# Patient Record
Sex: Female | Born: 1985 | Hispanic: Yes | Marital: Single | Smoking: Never smoker
Health system: Southern US, Community
[De-identification: ages and names within clinical notes are randomized; demographics above are authoritative.]

## PROBLEM LIST (undated history)

## (undated) DIAGNOSIS — Z789 Other specified health status: Secondary | ICD-10-CM

## (undated) HISTORY — PX: OTHER SURGICAL HISTORY: SHX169

## (undated) HISTORY — DX: Other specified health status: Z78.9

---

## 2010-02-22 ENCOUNTER — Ambulatory Visit: Payer: Self-pay | Admitting: Family Medicine

## 2010-06-28 ENCOUNTER — Observation Stay: Payer: Self-pay | Admitting: Obstetrics and Gynecology

## 2010-06-29 ENCOUNTER — Observation Stay: Payer: Self-pay | Admitting: Obstetrics and Gynecology

## 2010-06-30 ENCOUNTER — Observation Stay: Payer: Self-pay | Admitting: Obstetrics and Gynecology

## 2010-07-02 ENCOUNTER — Inpatient Hospital Stay: Payer: Self-pay | Admitting: Obstetrics and Gynecology

## 2015-01-08 DIAGNOSIS — E663 Overweight: Secondary | ICD-10-CM | POA: Insufficient documentation

## 2015-01-09 ENCOUNTER — Ambulatory Visit: Payer: Self-pay | Admitting: Family Medicine

## 2015-01-11 ENCOUNTER — Emergency Department: Payer: Self-pay | Admitting: Student

## 2015-02-23 IMAGING — US US OB < 14 WEEKS - US OB TV
1 series · 14 of 28 positions shown · non-contrast
Comparison: None.

CLINICAL DATA: Vaginal spotting

EXAM:
OBSTETRIC <14 WK US AND TRANSVAGINAL OB US
TECHNIQUE: Both transabdominal and transvaginal ultrasound examinations were
performed for complete evaluation of the gestation as well as the
maternal uterus, adnexal regions, and pelvic cul-de-sac.
Transvaginal technique was performed to assess early pregnancy.

[Series 1: us ob < 14 weeks - us ob tv · 0.25mm/px · 14 of 80 slices shown]
[im 3/80]
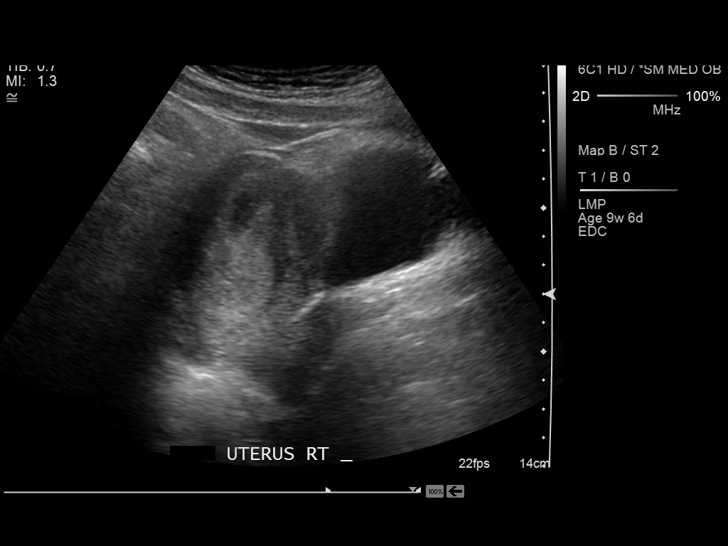
[im 9/80]
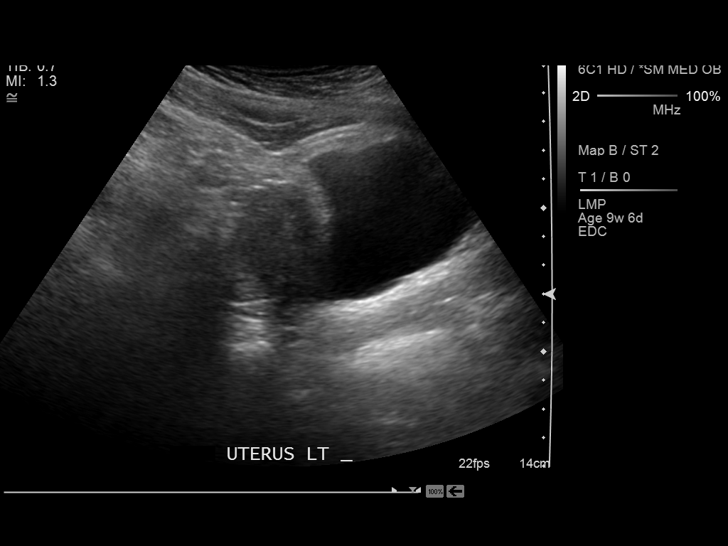
[im 15/80]
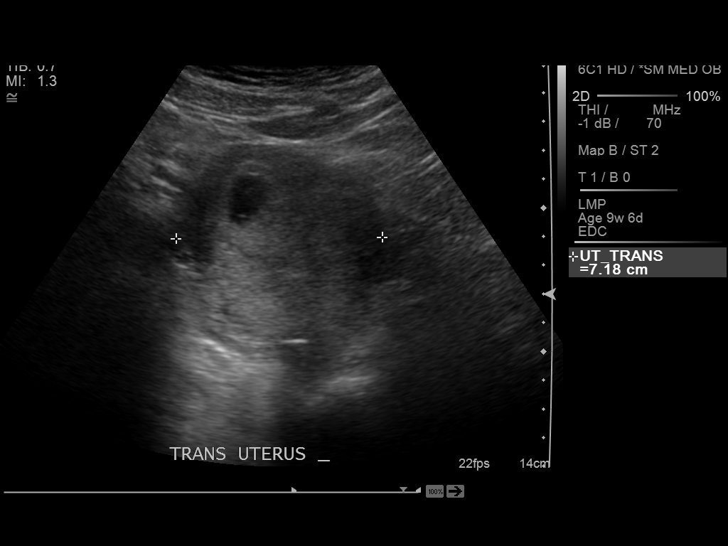
[im 21/80]
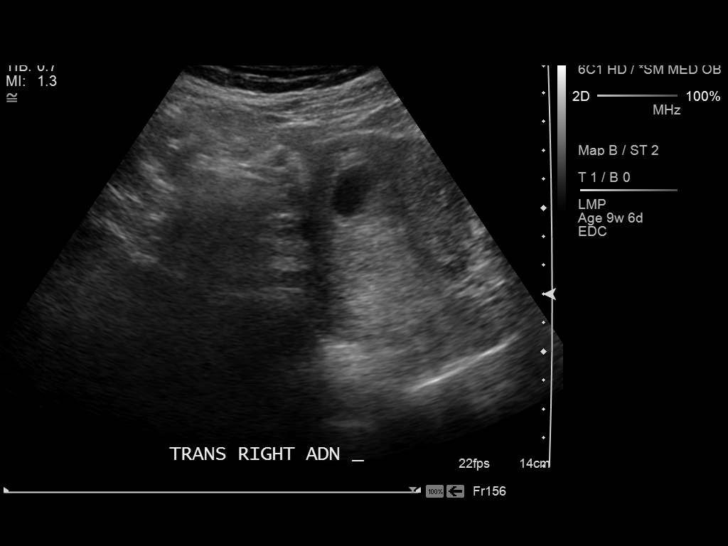
[im 27/80]
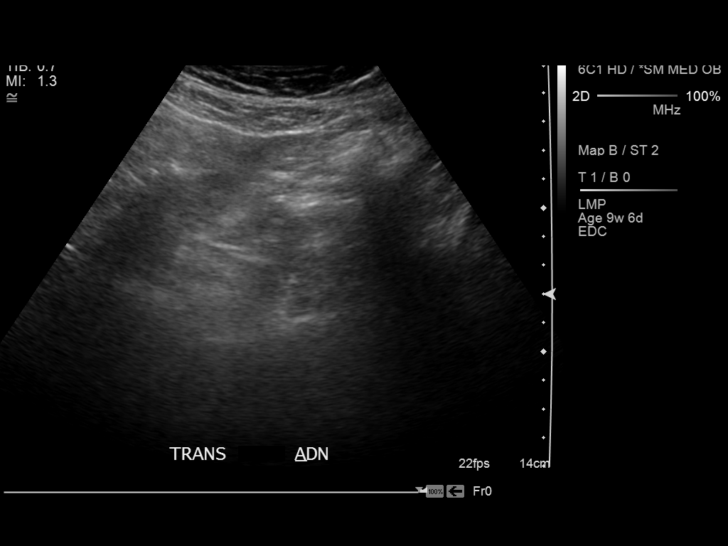
[im 33/80]
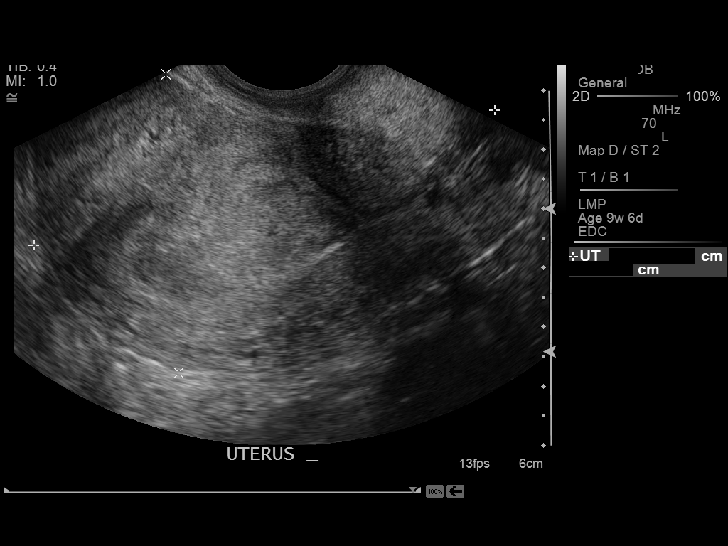
[im 39/80]
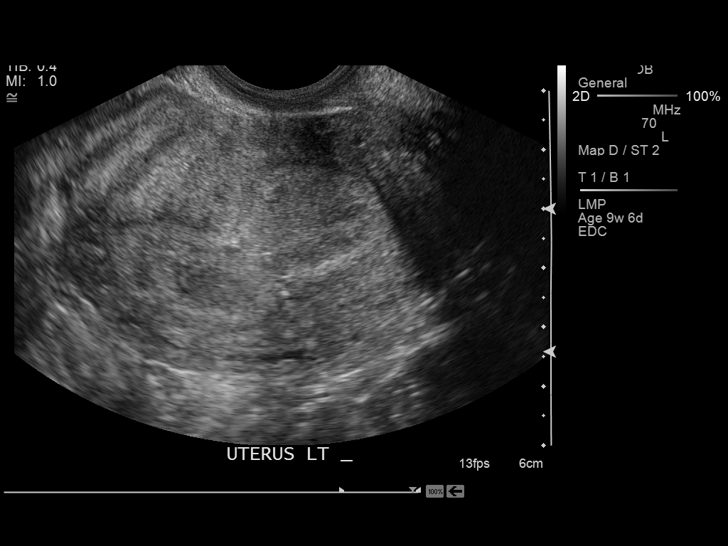
[im 44/80]
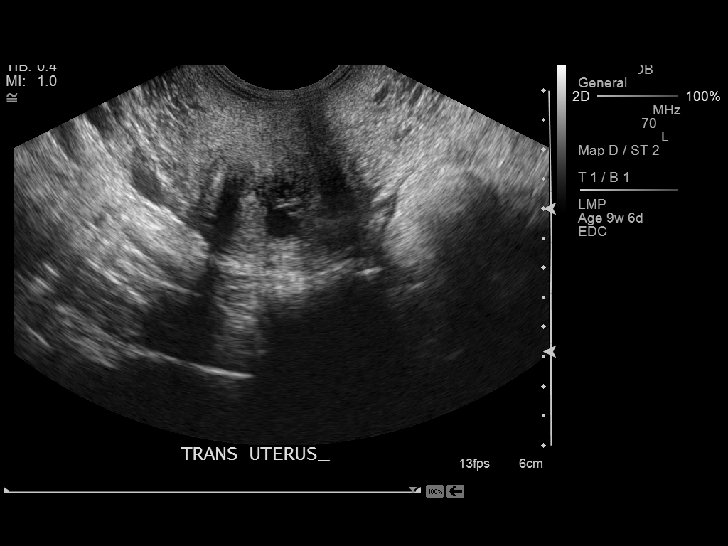
[im 50/80]
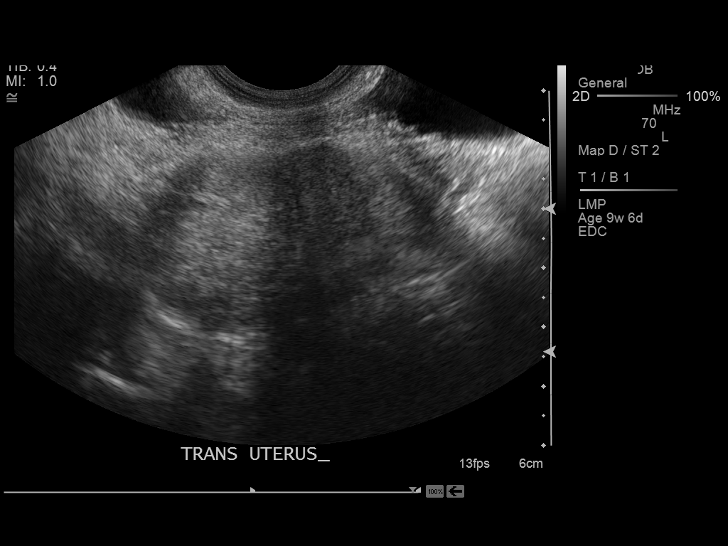
[im 56/80]
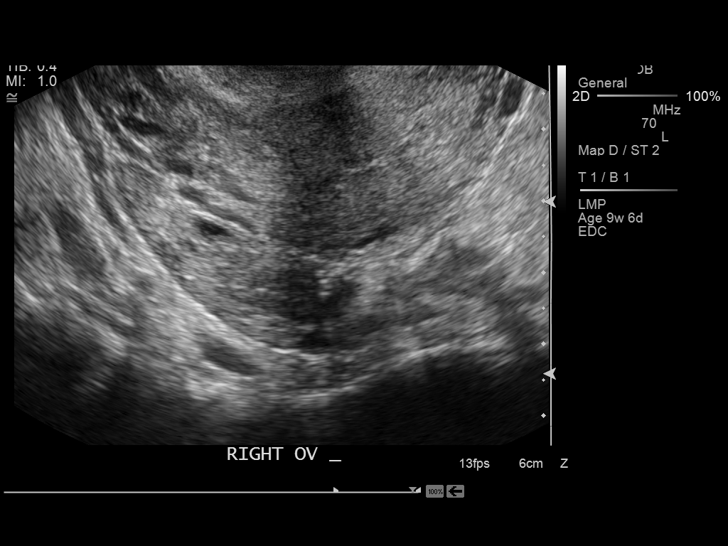
[im 62/80]
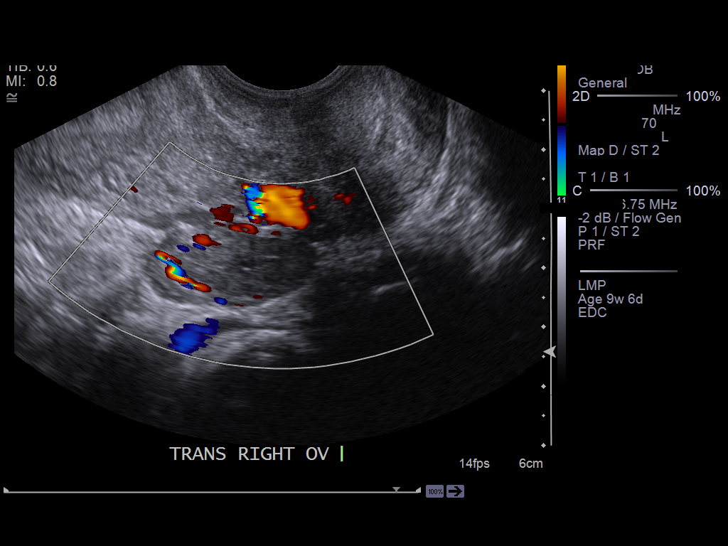
[im 68/80]
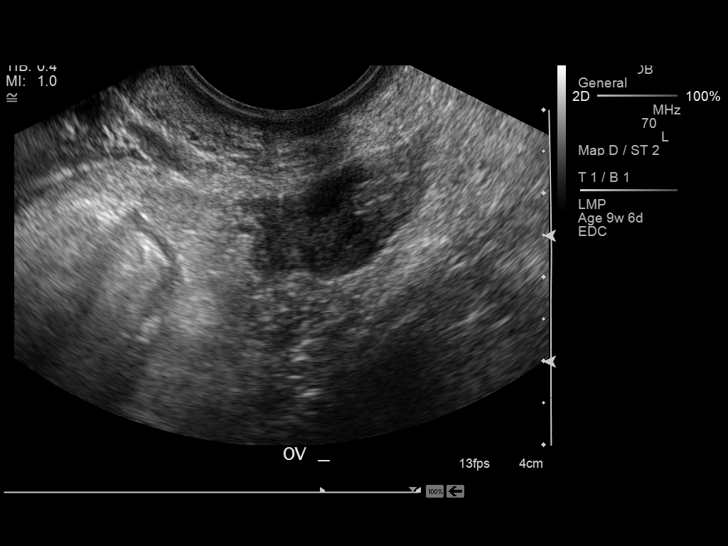
[im 74/80]
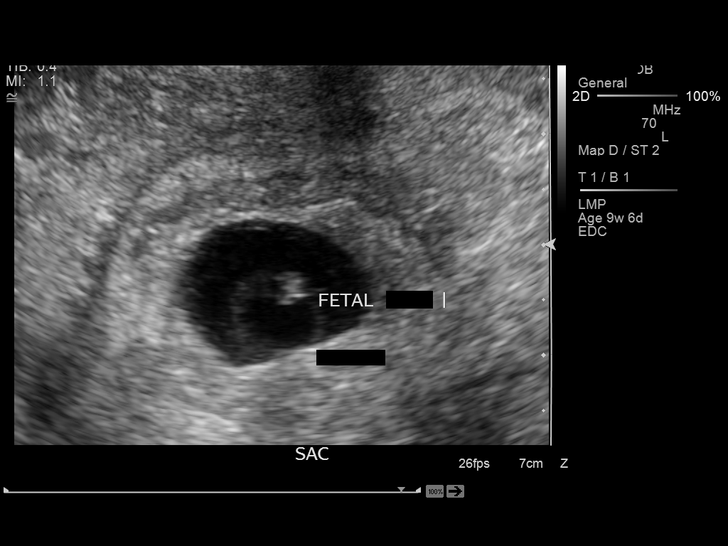
[im 80/80]
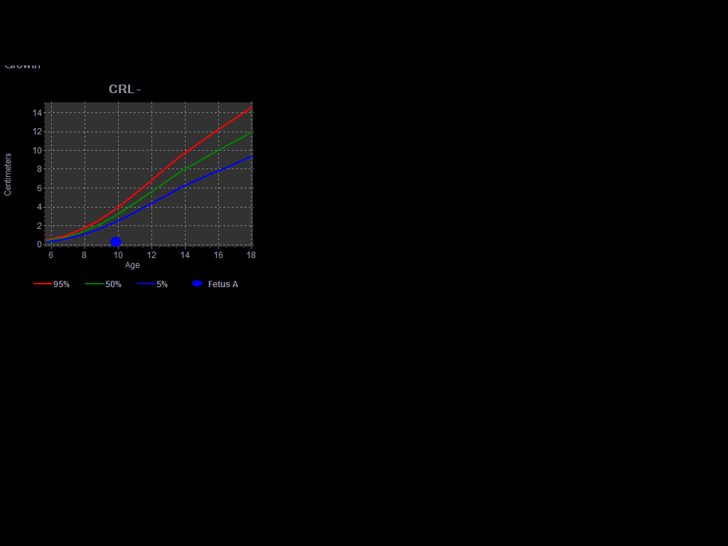

[14 of 28 positions shown; findings below may reference images not displayed]

FINDINGS: Intrauterine gestational sac: Visualized/normal in shape.

Yolk sac:  Visualized

Embryo:  Visualized

Cardiac Activity: Not visualized

CRL:  3 mm  mm   5 w   6 d                  US EDC: September 05, 2015

Maternal uterus/adnexae: There is no demonstrable subchorionic
hemorrhage. The cervical os is closed. There is a probable right
corpus luteum measuring 2.2 x 1.4 x 1.9 cm. No other extrauterine
pelvic or adnexal mass. No free pelvic fluid.
IMPRESSION: There is a single intrauterine gestation. Cardiac activity is not
yet seen by either transabdominal or transvaginal technique. This
finding warrants a followup study in approximately 14 days to
further evaluate for fetal heart activity. Study otherwise
unremarkable. Corpus luteum on the right is a physiologic finding.

## 2016-10-06 LAB — OB RESULTS CONSOLE HEPATITIS B SURFACE ANTIGEN: Hepatitis B Surface Ag: NEGATIVE

## 2016-10-07 LAB — OB RESULTS CONSOLE HIV ANTIBODY (ROUTINE TESTING): HIV: NONREACTIVE

## 2016-10-07 LAB — OB RESULTS CONSOLE RPR: RPR: NONREACTIVE

## 2016-11-28 NOTE — L&D Delivery Note (Signed)
Date of delivery: 5/283/18 Estimated Date of Delivery: 05/10/17 Patient's last menstrual period was 08/07/2016. EGA: 495w0d  Delivery Note At 2:01 AM a viable female was delivered via Vaginal, Spontaneous Delivery (Presentation: ROA,).  APGAR: 8, 8; weight 3lb 12oz, 1690g .   Placenta status: intact, spontaneous.  Cord: 3vessels  with the following complications: none apparent. Thick meconium. Cord pH: arterial: 7.08 BE 9.2 ,   Venous: 7.03 BE 8.5  Anesthesia:  none Episiotomy:  none Lacerations:  Left periuretheral Suture Repair: 3.0 vicryl Est. Blood Loss (mL):  300cc  Mom presented to L&D with pelvic pain, found to be in pretern labor at 36.6 with preeclampsia and deep variables. Progressed from 1 to 4, then precipitiously progressed from 5 to complete, AROM for meconium. second stage: <165min with delivery of fetal head with restitution to ROT.   Anterior then posterior shoulders delivered without difficulty.  Baby placed on mom's chest, but sluggish so cord doubly clamped and cut while being attended to by peds.  Placenta small, meconium stained, spontaneously delivered, intact.   IV pitocin given for hemorrhage prophylaxis. Bright red bleeding, cervix intact, determined to be small periurethral tear, repaired with 3-0 Vicryl in figure-of-eight and hemostatic. Baby was given to mom briefly then brought to NICU for monitoring due to small size. There, we sang happy birthday to baby Jesus.    Mom to postpartum.  Baby to NICU.  Erian Rosengren C Tresia Revolorio 04/19/2017, 2:42 AM

## 2016-12-01 ENCOUNTER — Other Ambulatory Visit (HOSPITAL_COMMUNITY): Payer: Self-pay | Admitting: Family Medicine

## 2016-12-01 DIAGNOSIS — Z3482 Encounter for supervision of other normal pregnancy, second trimester: Secondary | ICD-10-CM

## 2016-12-15 ENCOUNTER — Encounter (HOSPITAL_COMMUNITY): Payer: Self-pay

## 2016-12-15 ENCOUNTER — Ambulatory Visit
Admission: RE | Admit: 2016-12-15 | Discharge: 2016-12-15 | Disposition: A | Discharge status: Home / Self Care | Payer: Self-pay | Source: Ambulatory Visit | Attending: Family Medicine | Admitting: Family Medicine

## 2016-12-15 DIAGNOSIS — Z3482 Encounter for supervision of other normal pregnancy, second trimester: Secondary | ICD-10-CM | POA: Insufficient documentation

## 2016-12-15 DIAGNOSIS — Z3A19 19 weeks gestation of pregnancy: Secondary | ICD-10-CM | POA: Insufficient documentation

## 2017-02-22 LAB — HM HIV SCREENING LAB: HM HIV Screening: NEGATIVE

## 2017-04-12 LAB — OB RESULTS CONSOLE GC/CHLAMYDIA
Chlamydia: NEGATIVE
GC PROBE AMP, GENITAL: NEGATIVE

## 2017-04-13 LAB — OB RESULTS CONSOLE GBS: STREP GROUP B AG: NEGATIVE

## 2017-04-18 ENCOUNTER — Inpatient Hospital Stay
Admission: EM | Admit: 2017-04-18 | Discharge: 2017-04-21 | DRG: 775 | Disposition: A | Discharge status: Home / Self Care | Payer: Medicaid Other | Attending: Obstetrics & Gynecology | Admitting: Obstetrics & Gynecology

## 2017-04-18 ENCOUNTER — Encounter: Payer: Self-pay | Admitting: *Deleted

## 2017-04-18 DIAGNOSIS — O99214 Obesity complicating childbirth: Secondary | ICD-10-CM | POA: Diagnosis present

## 2017-04-18 DIAGNOSIS — D509 Iron deficiency anemia, unspecified: Secondary | ICD-10-CM | POA: Diagnosis present

## 2017-04-18 DIAGNOSIS — E669 Obesity, unspecified: Secondary | ICD-10-CM | POA: Diagnosis present

## 2017-04-18 DIAGNOSIS — Z6833 Body mass index (BMI) 33.0-33.9, adult: Secondary | ICD-10-CM | POA: Diagnosis not present

## 2017-04-18 DIAGNOSIS — O9902 Anemia complicating childbirth: Secondary | ICD-10-CM | POA: Diagnosis present

## 2017-04-18 DIAGNOSIS — Z3A36 36 weeks gestation of pregnancy: Secondary | ICD-10-CM | POA: Diagnosis not present

## 2017-04-18 DIAGNOSIS — O365930 Maternal care for other known or suspected poor fetal growth, third trimester, not applicable or unspecified: Secondary | ICD-10-CM | POA: Diagnosis present

## 2017-04-18 DIAGNOSIS — O1490 Unspecified pre-eclampsia, unspecified trimester: Secondary | ICD-10-CM | POA: Diagnosis present

## 2017-04-18 DIAGNOSIS — O134 Gestational [pregnancy-induced] hypertension without significant proteinuria, complicating childbirth: Principal | ICD-10-CM | POA: Diagnosis present

## 2017-04-18 DIAGNOSIS — R109 Unspecified abdominal pain: Secondary | ICD-10-CM | POA: Diagnosis present

## 2017-04-18 LAB — COMPREHENSIVE METABOLIC PANEL
ALT: 27 U/L (ref 14–54)
ANION GAP: 6 (ref 5–15)
AST: 32 U/L (ref 15–41)
Albumin: 2.6 g/dL — ABNORMAL LOW (ref 3.5–5.0)
Alkaline Phosphatase: 170 U/L — ABNORMAL HIGH (ref 38–126)
BUN: 19 mg/dL (ref 6–20)
CHLORIDE: 109 mmol/L (ref 101–111)
CO2: 21 mmol/L — AB (ref 22–32)
Calcium: 8.5 mg/dL — ABNORMAL LOW (ref 8.9–10.3)
Creatinine, Ser: 0.68 mg/dL (ref 0.44–1.00)
GFR calc non Af Amer: >60 mL/min (ref >60)
Glucose, Bld: 77 mg/dL (ref 65–99)
POTASSIUM: 4 mmol/L (ref 3.5–5.1)
SODIUM: 136 mmol/L (ref 135–145)
Total Bilirubin: 0.2 mg/dL — ABNORMAL LOW (ref 0.3–1.2)
Total Protein: 6.3 g/dL — ABNORMAL LOW (ref 6.5–8.1)

## 2017-04-18 LAB — PROTEIN / CREATININE RATIO, URINE
CREATININE, URINE: 28 mg/dL
PROTEIN CREATININE RATIO: 3.68 mg/mg{creat} — AB (ref 0.00–0.15)
Total Protein, Urine: 103 mg/dL

## 2017-04-18 LAB — CBC
HEMATOCRIT: 37.6 % (ref 35.0–47.0)
Hemoglobin: 12.9 g/dL (ref 12.0–16.0)
MCH: 30.6 pg (ref 26.0–34.0)
MCHC: 34.2 g/dL (ref 32.0–36.0)
MCV: 89.3 fL (ref 80.0–100.0)
Platelets: 186 10*3/uL (ref 150–440)
RBC: 4.21 MIL/uL (ref 3.80–5.20)
RDW: 13.1 % (ref 11.5–14.5)
WBC: 10.2 10*3/uL (ref 3.6–11.0)

## 2017-04-18 MED ORDER — OXYTOCIN 40 UNITS IN LACTATED RINGERS INFUSION - SIMPLE MED
2.5000 [IU]/h | INTRAVENOUS | Status: DC
  Filled 2017-04-18: qty 1000

## 2017-04-18 MED ORDER — OXYTOCIN BOLUS FROM INFUSION
500.0000 mL | Freq: Once | INTRAVENOUS | Status: AC
  Administered 2017-04-19: 500 mL via INTRAVENOUS

## 2017-04-18 MED ORDER — SOD CITRATE-CITRIC ACID 500-334 MG/5ML PO SOLN
30.0000 mL | ORAL | Status: DC | PRN
  Filled 2017-04-18: qty 30

## 2017-04-18 MED ORDER — LABETALOL HCL 5 MG/ML IV SOLN
20.0000 mg | INTRAVENOUS | Status: DC | PRN
  Filled 2017-04-18: qty 16

## 2017-04-18 MED ORDER — SODIUM CHLORIDE 0.9 % IJ SOLN
INTRAMUSCULAR | Status: AC
  Filled 2017-04-18: qty 50

## 2017-04-18 MED ORDER — BUTORPHANOL TARTRATE 1 MG/ML IJ SOLN: 1.0000 mg | INTRAMUSCULAR | Status: DC | PRN

## 2017-04-18 MED ORDER — MISOPROSTOL 25 MCG QUARTER TABLET: 25.0000 ug | ORAL_TABLET | ORAL | Status: DC | PRN

## 2017-04-18 MED ORDER — LACTATED RINGERS IV SOLN: INTRAVENOUS | Status: DC

## 2017-04-18 MED ORDER — HYDRALAZINE HCL 20 MG/ML IJ SOLN
10.0000 mg | Freq: Once | INTRAMUSCULAR | Status: DC | PRN
  Filled 2017-04-18: qty 0.5

## 2017-04-18 MED ORDER — ACETAMINOPHEN 325 MG PO TABS: 650.0000 mg | ORAL_TABLET | ORAL | Status: DC | PRN

## 2017-04-18 MED ORDER — OXYTOCIN 40 UNITS IN LACTATED RINGERS INFUSION - SIMPLE MED: 1.0000 m[IU]/min | INTRAVENOUS | Status: DC

## 2017-04-18 MED ORDER — TERBUTALINE SULFATE 1 MG/ML IJ SOLN: 0.2500 mg | Freq: Once | INTRAMUSCULAR | Status: DC | PRN

## 2017-04-18 MED ORDER — LACTATED RINGERS IV SOLN: 500.0000 mL | INTRAVENOUS | Status: DC | PRN

## 2017-04-18 MED ORDER — LIDOCAINE HCL (PF) 1 % IJ SOLN
30.0000 mL | INTRAMUSCULAR | Status: DC | PRN
  Administered 2017-04-19: 30 mL via SUBCUTANEOUS

## 2017-04-18 MED ORDER — ONDANSETRON HCL 4 MG/2ML IJ SOLN: 4.0000 mg | Freq: Four times a day (QID) | INTRAMUSCULAR | Status: DC | PRN

## 2017-04-18 NOTE — Progress Notes (Signed)
Intrapartum note:  S: pain with contractions  O: BP (!) 143/80   Pulse 60   Temp 97.9 F (36.6 C) (Oral)   Resp 16   Ht 5' (1.524 m)   Wt 76.7 kg (169 lb)   LMP 08/07/2016   BMI 33.01 kg/m   SVE: 3.5cm/ 70% / -2 TOCO: difficult to trace FHT: 150 mod + accels + deep variables to 60s with spontaneous recovery  A/P:  30yo G2P1001 @ 36.6 with labor, at least GHTN, and recurrent category 2 strip  1. IUP:  Category 2 tracing with moderate variability and accels.  Fetal acidemia unlikely; continue with labor course.  May consider AROM to place IUPC and do amnioinfusion 2. Labor: expectant mgmt 3. GHTN: labs pending for preeclampsia dx.  No s/sx worsening or severe preeclampsia.  ----- Ranae Plumberhelsea Ward, MD Attending Obstetrician and Gynecologist [facility], Department of OB/GYN [facility]

## 2017-04-18 NOTE — H&P (Addendum)
OB History & Physical   History of Present Illness:  Chief Complaint:   HPI:  Alice Craig is a 31 y.o. [id number] female at 1362w6d dated by LMP of 08/07/16 c/w 12wk US with EDC of 05/10/17.  She presents to L&D with intermittent groin pain   +FM, no CTX, no LOF, no VB Denies: HA, visual changes, SOB, or RUQ/epigastric pain   Pregnancy Issues: 1. H/o preeclampsia with IOL @ 36wks in last pregnancy 6y ago 2. GHTN with this pregnancy; 24hr urine results not available 3. History of preterm birth 174. History of iron deficiency anemia  Maternal Medical History:  History reviewed. No pertinent past medical history.  History reviewed. No pertinent surgical history.  No Known Allergies  Prior to Admission medications   Medication Sig Start Date End Date Taking? Authorizing Provider  Prenatal Vit-Fe Fumarate-FA (PRENATAL MULTIVITAMIN) TABS tablet Take 1 tablet by mouth daily at 12 noon.   Yes [provider]     [facility]ren[facility] care [facility]   Social History: She  reports that she has never smoked. She has never used smokeless tobacco. She reports that she does not drink alcohol or use drugs.  Family History:  no gyn cancers  Review of Systems: A full review of systems was performed and negative except as noted in the HPI.     Physical Exam:  Vital Signs: BP 139/87   Pulse 69   Temp 97.9 F (36.6 C) (Oral)   Resp 16   Ht 5' (1.524 m)   Wt 76.7 kg (169 lb)   LMP 08/07/2016   BMI 33.01 kg/m  General: no acute distress.  HEENT: normocephalic, atraumatic Heart: regular rate & rhythm.  No murmurs/rubs/gallops Lungs: clear to auscultation bilaterally, normal respiratory effort Abdomen: soft, gravid, non-tender;  EFW: 6.3 Pelvic:   External: Normal external female genitalia  Cervix: Dilation: 1 / Effacement (%): 60 / Station: -2    Extremities: non-tender, symmetric, 1+ edema bilaterally.  DTRs: 2+  Neurologic: Alert & oriented x 3.    No  results found for this or any previous visit (from the past 24 hour(s)).  Pertinent Results:  Prenatal Labs: Blood type/Rh O+  Antibody screen neg  Rubella Immune  Varicella Immune  RPR NR  HBsAg Neg  HIV NR  GC neg  Chlamydia neg  Genetic screening negative  1 hour GTT 103  3 hour GTT   GBS neg   FHT: 150 mod + accels + occasional lates and occasional deep variables TOCO: irregular (poorly tracing)   Cephalic by leopolds   Assessment:  Alice Craig is a 31 y.o. [phone] female at 3062w6d with GHTN and category 2 strip.   Plan:  1. Admit to Labor & Delivery 2. CBC, T&S, Clrs, IVF 3. GBS negative  4. Consents obtained. 5. Continuous efm/toco 6. Move to labor room, will keep due to recurrent variables and category 2 tracing.  Will determine IOL method with cervical recheck; likely foley bulb. 7. GHTN vs Preeclampsia: no s/sx severe at this time.    ----- Ranae Plumberhelsea Ward, MD Attending Obstetrician and Gynecologist [facility], Department of OB/GYN [facility]

## 2017-04-18 NOTE — OB Triage Note (Signed)
Recvd pt from ED. Pt is spanish speaking only. We have interpreter on a stick. Pt c/o low back pain and some cramping. States she did not want to wait till tomorrow to come in. She has a scheduled induction for tomorrow. Feeling baby move ok.

## 2017-04-19 DIAGNOSIS — D509 Iron deficiency anemia, unspecified: Secondary | ICD-10-CM | POA: Diagnosis present

## 2017-04-19 DIAGNOSIS — O1490 Unspecified pre-eclampsia, unspecified trimester: Secondary | ICD-10-CM | POA: Diagnosis present

## 2017-04-19 LAB — CBC
HCT: 35.7 % (ref 35.0–47.0)
Hemoglobin: 12.2 g/dL (ref 12.0–16.0)
MCH: 31 pg (ref 26.0–34.0)
MCHC: 34.1 g/dL (ref 32.0–36.0)
MCV: 90.9 fL (ref 80.0–100.0)
PLATELETS: 152 10*3/uL (ref 150–440)
RBC: 3.93 MIL/uL (ref 3.80–5.20)
RDW: 13.4 % (ref 11.5–14.5)
WBC: 14 10*3/uL — AB (ref 3.6–11.0)

## 2017-04-19 LAB — TYPE AND SCREEN
ABO/RH(D): O POS
Antibody Screen: NEGATIVE

## 2017-04-19 MED ORDER — HYDROCORTISONE 2.5 % RE CREA
TOPICAL_CREAM | RECTAL | Status: DC | PRN
  Filled 2017-04-19: qty 28.35

## 2017-04-19 MED ORDER — MISOPROSTOL 200 MCG PO TABS
ORAL_TABLET | ORAL | Status: AC
  Filled 2017-04-19: qty 4

## 2017-04-19 MED ORDER — IBUPROFEN 600 MG PO TABS
600.0000 mg | ORAL_TABLET | Freq: Four times a day (QID) | ORAL | Status: DC
  Administered 2017-04-19 – 2017-04-21 (×9): 600 mg via ORAL
  Filled 2017-04-19 (×9): qty 1

## 2017-04-19 MED ORDER — DOCUSATE SODIUM 100 MG PO CAPS
100.0000 mg | ORAL_CAPSULE | Freq: Two times a day (BID) | ORAL | Status: DC
  Administered 2017-04-19 – 2017-04-21 (×4): 100 mg via ORAL
  Filled 2017-04-19 (×4): qty 1

## 2017-04-19 MED ORDER — ONDANSETRON HCL 4 MG PO TABS: 4.0000 mg | ORAL_TABLET | ORAL | Status: DC | PRN

## 2017-04-19 MED ORDER — WITCH HAZEL-GLYCERIN EX PADS: 1.0000 "application " | MEDICATED_PAD | CUTANEOUS | Status: DC | PRN

## 2017-04-19 MED ORDER — ONDANSETRON HCL 4 MG/2ML IJ SOLN
4.0000 mg | INTRAMUSCULAR | Status: DC | PRN
Start: 2017-04-19 — End: 2017-04-21

## 2017-04-19 MED ORDER — OXYTOCIN 10 UNIT/ML IJ SOLN
INTRAMUSCULAR | Status: AC
  Filled 2017-04-19: qty 2

## 2017-04-19 MED ORDER — BENZOCAINE-MENTHOL 20-0.5 % EX AERO: 1.0000 "application " | INHALATION_SPRAY | CUTANEOUS | Status: DC | PRN

## 2017-04-19 MED ORDER — AMMONIA AROMATIC IN INHA
RESPIRATORY_TRACT | Status: AC
  Filled 2017-04-19: qty 10

## 2017-04-19 MED ORDER — HYDROMORPHONE HCL 1 MG/ML IJ SOLN: 1.0000 mg | Freq: Once | INTRAMUSCULAR | Status: DC

## 2017-04-19 MED ORDER — IBUPROFEN 600 MG PO TABS
600.0000 mg | ORAL_TABLET | Freq: Four times a day (QID) | ORAL | Status: DC
  Administered 2017-04-19: 600 mg via ORAL
  Filled 2017-04-19: qty 1

## 2017-04-19 MED ORDER — DIPHENHYDRAMINE HCL 25 MG PO CAPS: 25.0000 mg | ORAL_CAPSULE | Freq: Four times a day (QID) | ORAL | Status: DC | PRN

## 2017-04-19 MED ORDER — PRENATAL MULTIVITAMIN CH
1.0000 | ORAL_TABLET | Freq: Every day | ORAL | Status: DC
  Administered 2017-04-19 – 2017-04-21 (×2): 1 via ORAL
  Filled 2017-04-19 (×2): qty 1

## 2017-04-19 MED ORDER — HYDROMORPHONE HCL 1 MG/ML IJ SOLN
INTRAMUSCULAR | Status: AC
  Filled 2017-04-19: qty 1

## 2017-04-19 MED ORDER — COCONUT OIL OIL
1.0000 | TOPICAL_OIL | Status: DC | PRN
Start: 2017-04-19 — End: 2017-04-21

## 2017-04-19 MED ORDER — SIMETHICONE 80 MG PO CHEW: 80.0000 mg | CHEWABLE_TABLET | ORAL | Status: DC | PRN

## 2017-04-19 MED ORDER — ACETAMINOPHEN 500 MG PO TABS
1000.0000 mg | ORAL_TABLET | Freq: Four times a day (QID) | ORAL | Status: DC | PRN
  Administered 2017-04-20: 1000 mg via ORAL
  Filled 2017-04-19: qty 2

## 2017-04-19 MED ORDER — LIDOCAINE HCL (PF) 1 % IJ SOLN
INTRAMUSCULAR | Status: AC
  Administered 2017-04-19: 30 mL via SUBCUTANEOUS
  Filled 2017-04-19: qty 30

## 2017-04-19 NOTE — Lactation Note (Signed)
This note was copied from a baby's chart. Lactation Consultation Note  Patient Name: Alice Craig Today's Date: 04/19/2017  Earlier today, I taught Mom about her milk supply and pumping for her baby in [place] with interpreter Maritza. I set it up and watched her demo the use of the pump. I taught her how to clean, label bottles, transport milk from home and how to [facility]. [facility] peer counselor Serafina Mitchellsmari spoke with her as well.    Maternal Data    Feeding    LATCH Score/Interventions                      Lactation Tools Discussed/Used     Consult Status      Sunday CornSandra Clark Keidra Withers 04/19/2017, 6:02 PM

## 2017-04-19 NOTE — Discharge Instructions (Signed)

## 2017-04-19 NOTE — Discharge Summary (Signed)
Obstetrical Discharge Summary  Patient Name: Alice Craig DOB: 08-15-1986 MRN: [id number]  Date of Admission: 04/18/2017 Date of Delivery: 04/19/17 Delivered by: Ranae Plumberhelsea Ward, MD Date of Discharge: 04/21/17 Primary OB: [facility]  NWG:[id number]:Patient's last menstrual period was 08/07/2016. EDC Estimated Date of Delivery: 05/10/17 Gestational Age at Delivery: 7255w0d   Antepartum complications: iron deficiency anemia, preeclampsia, obesity Admitting Diagnosis: preeclampsia, preterm labor Secondary Diagnosis: Patient Active Problem List   Diagnosis Date Noted  . Iron deficiency anemia 04/19/2017  . Preeclampsia 04/19/2017  . Preterm labor 04/19/2017  . SGA (small for gestational age), 1,500-1,749 grams 04/19/2017  . Labor and delivery indication for care or intervention 04/18/2017    Augmentation: none Complications: None Intrapartum complications/course: Mom presented to L&D with pelvic pain, found to be in pretern labor at 36.6 with preeclampsia and deep variables. Progressed from 1 to 4, then precipitiously progressed from 5 to complete, AROM for meconium. second stage: <55min with delivery of fetal head with restitution to ROT.   Anterior then posterior shoulders delivered without difficulty.  Baby placed on mom's chest, but sluggish so cord doubly clamped and cut while being attended to by peds.  Placenta small, meconium stained, spontaneously delivered, intact.   IV pitocin given for hemorrhage prophylaxis. Bright red bleeding, cervix intact, determined to be small periurethral tear, repaired with 3-0 Vicryl in figure-of-eight and hemostatic. Baby was given to mom briefly then brought to NICU for monitoring due to small size. There, we sang happy birthday to baby Jesus.   Date of Delivery: 04/19/17 Delivered By: Leeroy Bockhelsea Ward Delivery Type: spontaneous vaginal delivery Anesthesia: none Placenta: sponatneous Laceration: left periurethral  Episiotomy: none Newborn Data: Live born female  Birth  Weight: 3 lb 11.6 oz (1690 g) APGAR: 8, 8   Postpartum Procedures: none  Baby in nursery on d/c   Post partum course:  Patient had an uncomplicated postpartum course.  By time of discharge on PPD#2, her pain was controlled on oral pain medications; she had appropriate lochia and was ambulating, voiding without difficulty and tolerating regular diet.  She was deemed stable for discharge to home.    Discharge Physical Exam: 04/21/17 BP 126/80 (BP Location: Left Arm)   Pulse 71   Temp 98.8 F (37.1 C) (Oral)   Resp 18   Ht 5' (1.524 m)   Wt 76.7 kg (169 lb)   LMP 08/07/2016   SpO2 100%   Breastfeeding? Unknown   BMI 33.01 kg/m   General: NAD CV: RRR Pulm: CTABL, nl effort ABD: s/nd/nt, fundus firm and below the umbilicus Lochia: moderate DVT Evaluation: LE non-ttp, no evidence of DVT on exam.  Hemoglobin  Date Value Ref Range Status  04/19/2017 12.2 12.0 - 16.0 g/dL Final   HCT  Date Value Ref Range Status  04/19/2017 35.7 35.0 - 47.0 % Final     Disposition: stable, discharge to home. Baby Feeding: pumping  Baby Disposition: in nursery   Rh Immune globulin given: n/a Rubella vaccine given:  n/a Tdap vaccine given in AP or PP setting: AP Flu vaccine given in AP or PP setting: AP  Contraception: nexplanon PP  Prenatal Labs:  Blood type/Rh O+  Antibody screen neg  Rubella Immune  Varicella Immune  RPR NR  HBsAg Neg  HIV NR  GC neg  Chlamydia neg  Genetic screening negative  1 hour GTT 103  3 hour GTT   GBS neg      Plan:  Alice Craig was discharged to home in good condition.  Follow-up appointment at [facility] in 3 days for BP check   Discharge Medications: Allergies as of 04/21/2017   No Known Allergies     Medication List    TAKE these medications   ibuprofen 600 MG tablet Commonly known as:  ADVIL,MOTRIN Take 1 tablet (600 mg total) by mouth every 6 (six) hours.   prenatal multivitamin Tabs tablet Take 1 tablet by mouth daily at 12  noon.       Follow-up Information    Department, [facility] Follow up in 3 day(s).   Why:  for blood pressure and well-being check Contact information: [address] [place] [address] [phone]           Signed: Jennell Corner MD

## 2017-04-20 LAB — RPR: RPR Ser Ql: NONREACTIVE

## 2017-04-20 NOTE — Progress Notes (Addendum)
Post Partum Day 1 Subjective: "I am pumping but, nothing is coming out" Baby in NICU  Objective: Blood pressure 130/84, pulse 79, temperature 98.3 F (36.8 C), temperature source Oral, resp. rate 18, height 5' (1.524 m), weight 169 lb (76.7 kg), last menstrual period 08/07/2016, SpO2 99 %, unknown if currently breastfeeding.  Physical Exam:  General: alert, cooperative and appears stated age  Heart: S1S2, RRR, No M/R/G Lungs: CTA bilat, no W/R/R Lochia: appropriate Uterine Fundus: firm, Pt has a wrapped abd with a corset like ace wrap Voiding well, taking po well DVT Evaluation: Neg Homans    Recent Labs  04/18/17 2306 04/19/17 0605  HGB 12.9 12.2  HCT 37.6 35.7  WBC 10.2 14.0*  PLT 186 152    A:1. NSVD of pre-term infant 2. Lactation P: Dc in am   LOS: 2 days   Sharee Pimplearon W Jones 04/20/2017, 12:06 PM

## 2017-04-20 NOTE — Lactation Note (Addendum)
This note was copied from a baby's chart. Lactation Consultation Note  Patient Name: Alice Craig [id number] Date: 04/20/2017 Reason for consult: Follow-up assessment  Interpreter used for interview Maternal Data Has patient been taught Hand Expression?: Yes Does the patient have breastfeeding experience prior to this delivery?: Yes (did not make milk)      LATCH Score/Interventions     We have reviewed pumping and how to clean the pump kit as per the manufacturer. She has been told to pump every three hours around the clock. We called [employer]IC and she can pick up a pump today.      Consult Status   Continued support.   Lorenz Coaster Floreen Teegarden 04/20/2017, 2:11 PM

## 2017-04-21 LAB — SURGICAL PATHOLOGY

## 2017-04-21 MED ORDER — IBUPROFEN 600 MG PO TABS: 600.0000 mg | ORAL_TABLET | Freq: Four times a day (QID) | ORAL | 0 refills | Status: AC

## 2017-04-21 NOTE — Progress Notes (Signed)
Pt is ready to go home. Dc home ambulatory. Refused w/c. With sig other and child

## 2017-04-21 NOTE — Progress Notes (Signed)
Dc instructions and rx given. Interpreter assisted. Verbalized understanding. Answered all questions.  Pt is going to visit baby a little more before leaving the hospital.

## 2017-04-22 ENCOUNTER — Ambulatory Visit: Payer: Self-pay

## 2017-04-22 NOTE — Lactation Note (Signed)
This note was copied from a baby's chart. Lactation Consultation Note  Patient Name: Alice Teressa SenterJuana Ocampo Aranda [id number]'s Date: 04/22/2017  Mother of baby was unable to [facility] pump [facility] yesterday. I loaned her a symphony breast pump today [facility] use [facility], Rental agreement completed.  Mom was given instruction in use.    Maternal Data    Feeding Feeding Type: Formula  [facility] Score/Interventions                      Lactation Tools Discussed/Used     Consult Status      Alice Craig 04/22/2017, 4:58 PM

## 2017-04-27 ENCOUNTER — Ambulatory Visit: Payer: Self-pay

## 2017-04-27 NOTE — Lactation Note (Signed)
This note was copied from a baby's chart. Lactation Consultation Note  Patient Name: Alice Craig [id number]'UToday's Date: 04/27/2017 Reason for consult: Follow-up assessment   Maternal Data    Feeding Feeding Type: Bottle Fed - Breast Milk Nipple Type: Slow - flow Length of feed: 30 min   Lactation Tools Discussed/Used Tools: Bottle;Pump;23F feeding tube / Syringe  Mother has a ow milk supply but it is building. Consult Status      Alice Craig 04/27/2017, 12:36 PM

## 2017-05-17 ENCOUNTER — Ambulatory Visit: Payer: Self-pay

## 2017-05-17 NOTE — Lactation Note (Deleted)
This note was copied from a baby's chart. Lactation Consultation Note  Patient Name: Boy Teressa SenterJuana Ocampo Aranda Today's Date: 05/17/2017  Just prior to last feeding , baby Greig Castillandrew seemed to have some duskiness around his mouth. I asked Richarda OsmondSally Janicello, [facility] to [facility] him out before he fed. She said his O2s were 100%   Maternal Data    Feeding Feeding Type: Formula Nipple Type: Slow - flow Length of feed: 30 min  LATCH Score/Interventions                      Lactation Tools Discussed/Used     Consult Status      Sunday CornSandra Clark Izel Eisenhardt 05/17/2017, 4:19 PM

## 2017-12-07 IMAGING — US US OB COMP +14 WK
1 series · 14 of 28 positions shown · non-contrast
Comparison: none

CLINICAL DATA: Pregnancy.

EXAM:
OBSTETRICAL ULTRASOUND >14 WKS

[Series 1: us ob comp +14 wk · 0.26mm/px · 14 of 80 slices shown]
[im 3/80]
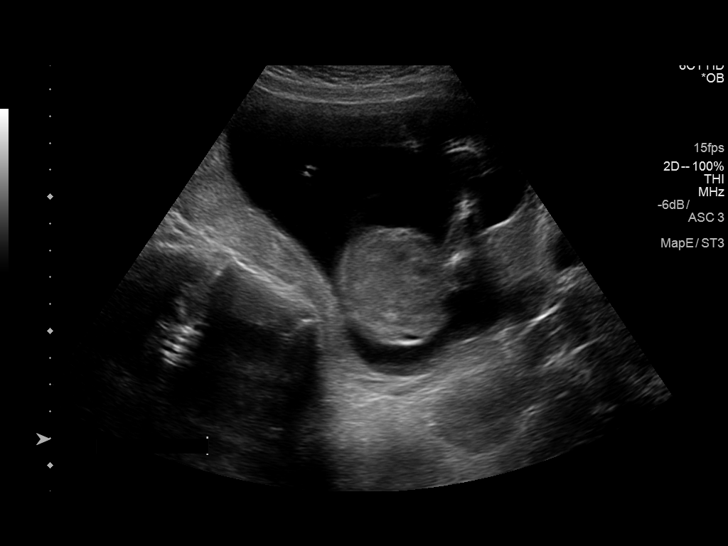
[im 9/80]
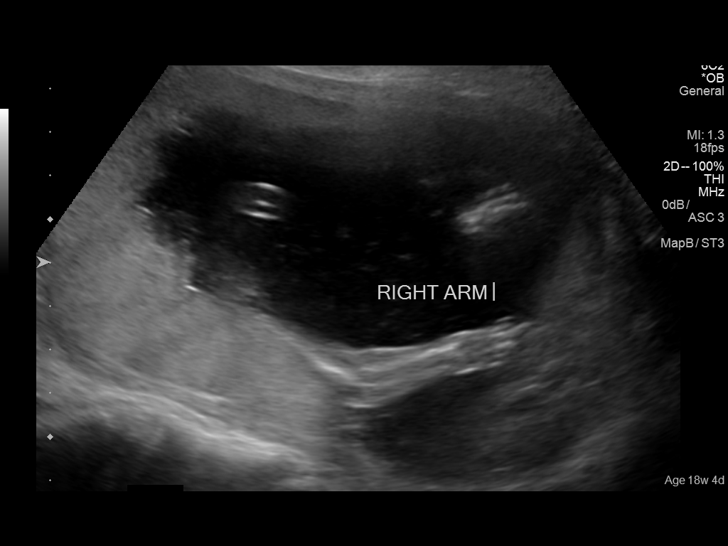
[im 15/80]
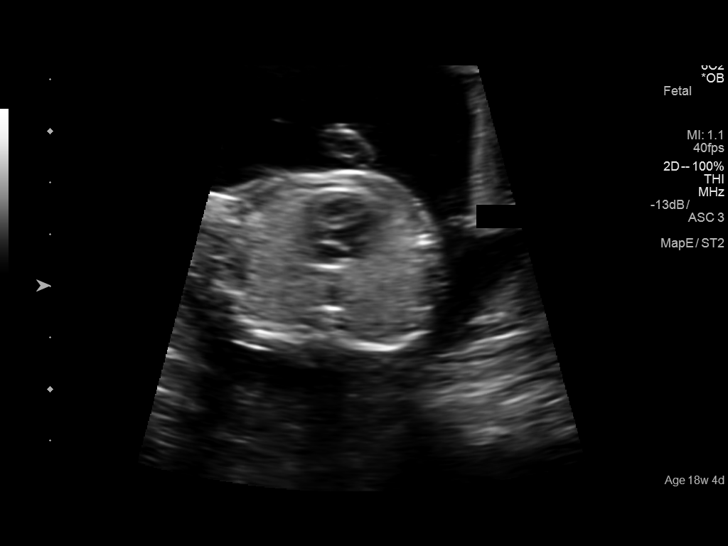
[im 21/80]
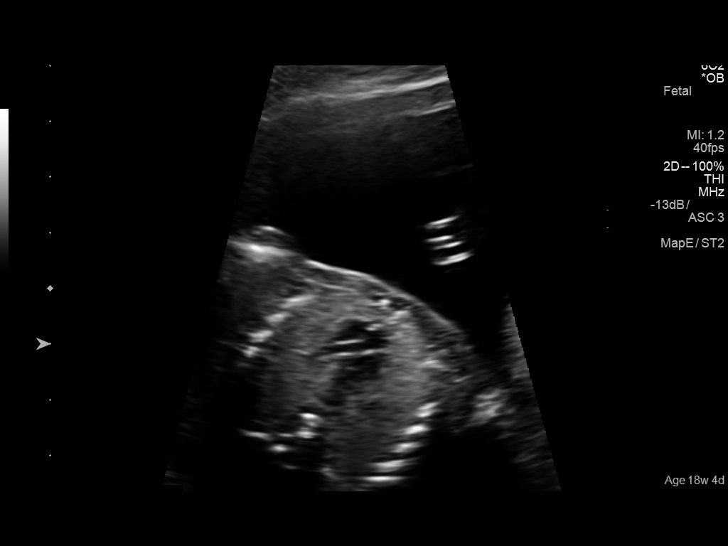
[im 27/80]
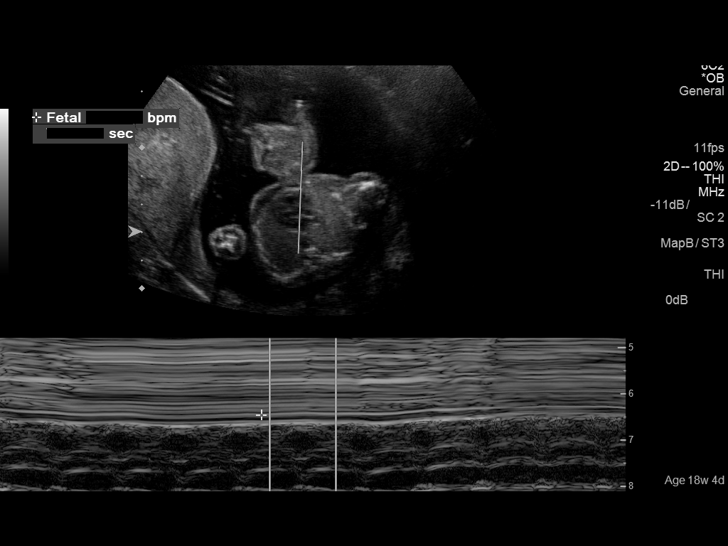
[im 33/80]
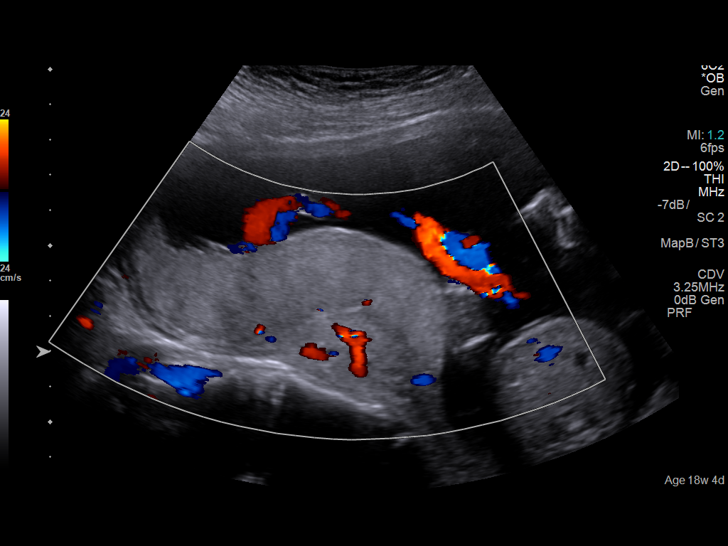
[im 39/80]
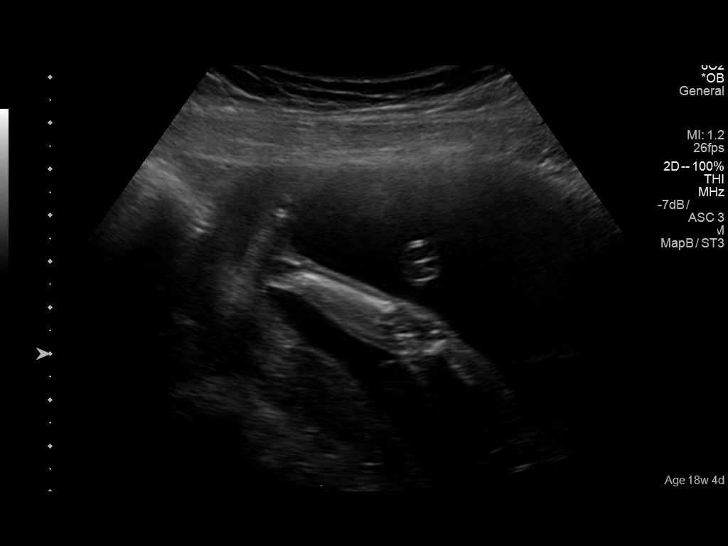
[im 44/80]
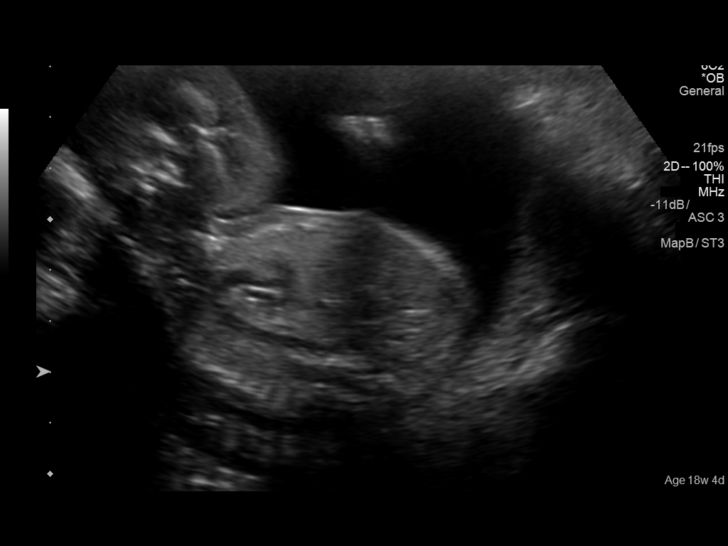
[im 50/80]
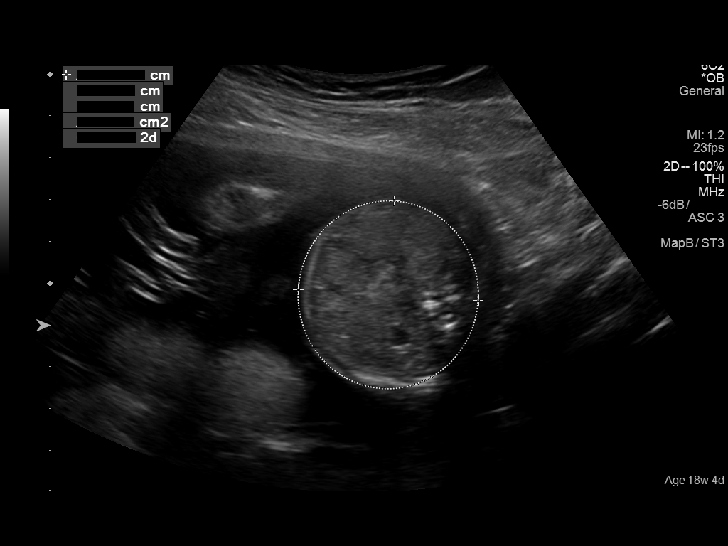
[im 56/80]
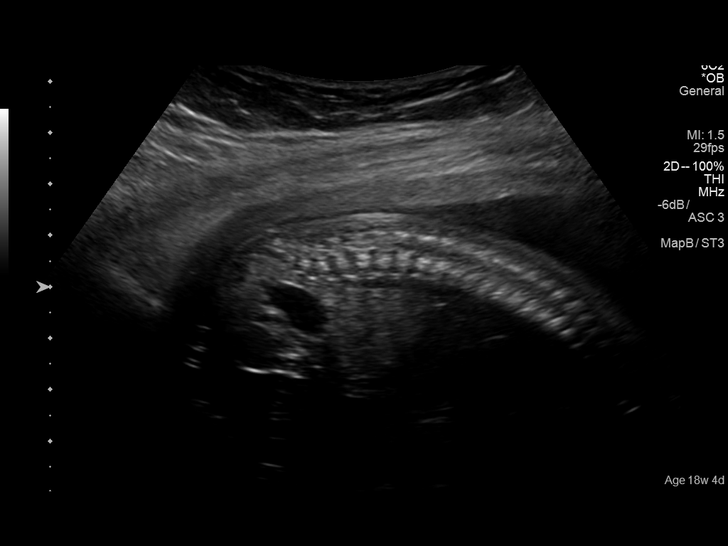
[im 62/80]
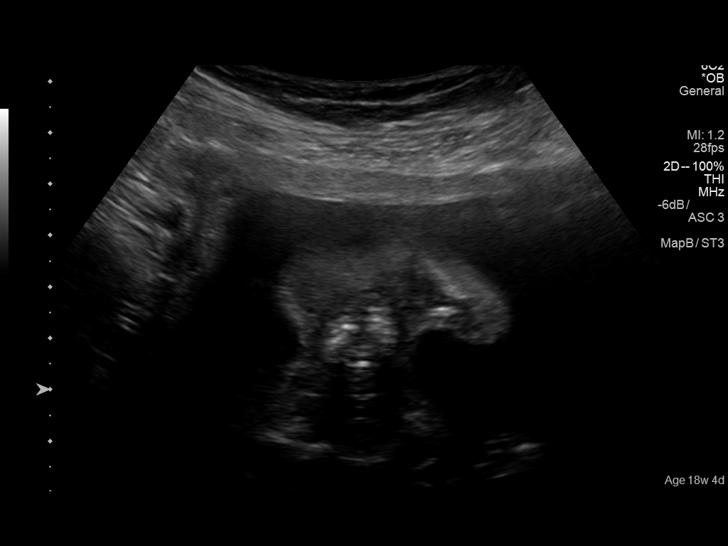
[im 68/80]
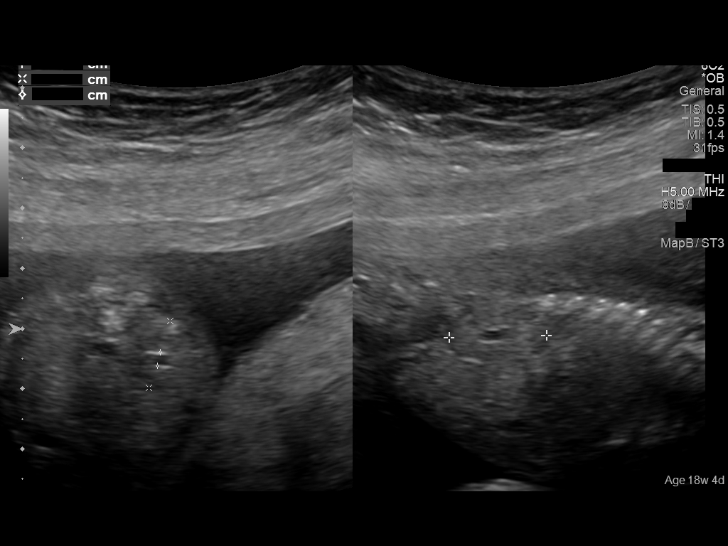
[im 74/80]
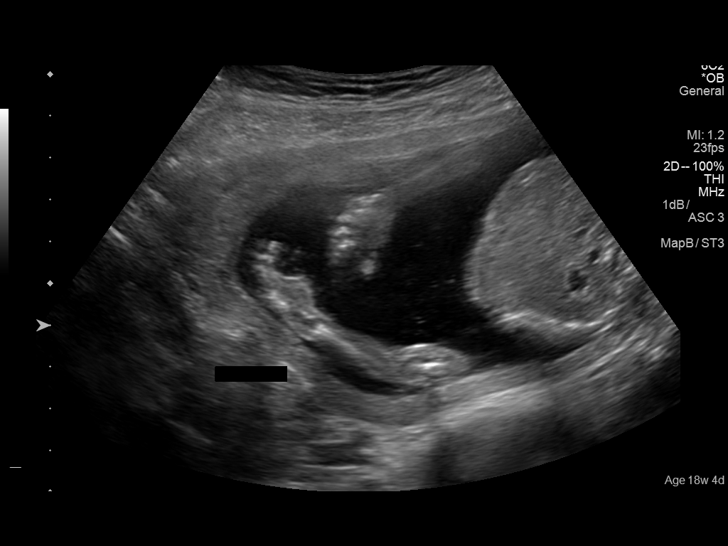
[im 80/80]
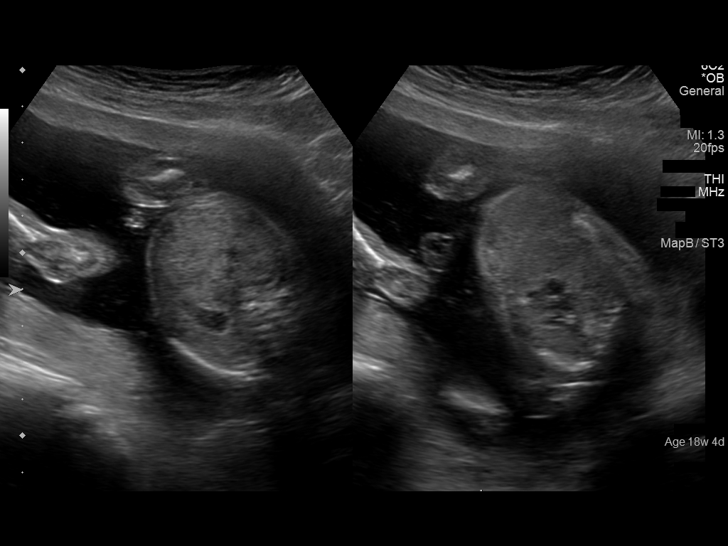

[14 of 28 positions shown; findings below may reference images not displayed]

FINDINGS: Number of Fetuses: 1

Heart Rate:  155 bpm

Movement: Present

Presentation: Variable

Previa: No

Placental Location: Posterior fundal

Amniotic Fluid (Subjective): Normal

Amniotic Fluid (Objective):

Vertical pocket 6.1cm

FETAL BIOMETRY

BPD:  4.6cm 19w 6d

HC:    17.0cm  19w   4d

AC:   13.9cm  19w   2d

FL:   2.7cm  18w   2d

Current Mean GA: 19w 1d              US EDC: 05/10/2017

Estimated Fetal Weight:  g    %ile

FETAL ANATOMY

Lateral Ventricles: Visualized

Thalami/CSP: Not visualized

Posterior Fossa:  Not visualized

Nuchal Region: Not visualized

Upper Lip: Visualized

Spine: Visualized

4 Chamber Heart on Left: Visualized

LVOT: Visualized

RVOT: Visualized

Stomach on Left: Visualized

3 Vessel Cord: Visualized

Cord Insertion site: Visualized

Kidneys: Visualized

Bladder: Visualized

Extremities: Visualize

Technically difficult due to: Fetal positioning. Brain anatomy was
particular difficult to evaluate.

Maternal Findings:

Cervix:  3.5 cm and closed.
IMPRESSION: Single viable intrauterine pregnancy at 19 weeks 1 day.

## 2018-01-29 LAB — HM PAP SMEAR: HM Pap smear: NEGATIVE

## 2020-01-15 DIAGNOSIS — E663 Overweight: Secondary | ICD-10-CM

## 2020-01-16 ENCOUNTER — Ambulatory Visit: Payer: Self-pay

## 2020-02-21 ENCOUNTER — Ambulatory Visit: Payer: Self-pay

## 2020-02-26 ENCOUNTER — Telehealth: Payer: Self-pay | Admitting: Family Medicine

## 2020-02-26 NOTE — Telephone Encounter (Signed)
Phone call to pt with interpreter Marlene Yemen. Pt states she is currently having vaginal bleeding, wants to know if she can still have IUD removed with the bleeding occurring. Pt counseled she can still come for her appt tomorrow.

## 2020-02-26 NOTE — Telephone Encounter (Signed)
patient wants to speak to nurse regarding her iud and also has an apt for tomorrow for a check up but would like to get a call before if possible

## 2020-02-27 ENCOUNTER — Other Ambulatory Visit: Payer: Self-pay

## 2020-02-27 ENCOUNTER — Ambulatory Visit (LOCAL_COMMUNITY_HEALTH_CENTER): Payer: Self-pay | Admitting: Physician Assistant

## 2020-02-27 VITALS — BP 91/68 | Ht 61.0 in | Wt 151.8 lb

## 2020-02-27 DIAGNOSIS — Z309 Encounter for contraceptive management, unspecified: Secondary | ICD-10-CM

## 2020-02-27 MED ORDER — NORGESTIMATE-ETH ESTRADIOL 0.25-35 MG-MCG PO TABS: 1.0000 | ORAL_TABLET | Freq: Every day | ORAL | 12 refills | Status: AC

## 2020-02-27 NOTE — Progress Notes (Signed)
Here today to have IUD removed. Last PE here was 06/22/2017, last Pap Smear here was 01/29/2018. Paragard IUD inserted here 12/25/2017. Wants to change to OCP's. Tawny Hopping, RN

## 2020-02-27 NOTE — Progress Notes (Signed)
8 of the 13 prescribed OCP's packs given due to expiration date. Instructed patient to call for an appt when she begins last pack of pills to come in and pick up remainder of pills. Patient verbalized understanding of same. Tawny Hopping, RN

## 2020-02-27 NOTE — Progress Notes (Signed)
[facility] problem visit  [facility]  Subjective:  Alice Craig is a 34 y.o. being seen today for   Chief Complaint  Patient presents with  . Gynecologic Exam    IUD check    34 yo woman who had Paragard IUD inserted at [facility] 12/25/17 requests IUD removal due to 8-mo h/o low back pain. Had eval of pain at [facility] 1-2 mo ago as she was concerned she may have a kidney infection, and reports that blood and urine testing was normal. No N/V/fever. Has regular menses with LMP 02/20/20, occ associated cramping that prn Tylenol resolves. Has not tried med for back pain. No nighttime sx.    Does the patient have a current or past history of drug use? No   No components found for: HCV]   Health Maintenance Due  Topic Date Due  . TETANUS/TDAP  Never done    ROS  The following portions of the patient's history were reviewed and updated as appropriate: allergies, current medications, past family history, past medical history, past social history, past surgical history and problem list. Problem list updated.   See flowsheet for other program required questions.  Objective:   Vitals:   02/27/20 0828  BP: 91/68  Weight: 151 lb 12.8 oz (68.9 kg)  Height: 5\' 1"  (1.549 m)    Physical Exam Constitutional:      Appearance: Normal appearance.  Pulmonary:     Effort: Pulmonary effort is normal.  Abdominal:     Palpations: Abdomen is soft.     Tenderness: There is no abdominal tenderness. There is no right CVA tenderness, left CVA tenderness or guarding.  Genitourinary:    Labia:        Right: No lesion.        Left: No lesion.      Urethra: No urethral swelling or urethral lesion.     Vagina: No vaginal discharge, tenderness or bleeding.     Cervix: No cervical motion tenderness, discharge or friability.     Uterus: Not tender.      Adnexa:        Right: No tenderness.         Left: No tenderness.     Comments: IUD strings visualized extending about 2cm from os. Lymphadenopathy:     Lower Body: No right inguinal adenopathy. No left inguinal adenopathy.  Skin:    General: Skin is warm and dry.  Neurological:     Mental Status: She is alert and oriented to person, place, and time.  Psychiatric:        Behavior: Behavior normal.        Thought Content: Thought content normal.        Judgment: Judgment normal.    IUD Removal  Patient identified, informed consent performed, consent signed.  Patient was in the dorsal lithotomy position, normal external genitalia was noted.  A speculum was placed in the patient's vagina, normal discharge was noted, no lesions. The cervix was visualized, no lesions, no abnormal discharge.  The strings of the IUD were grasped and pulled using ring forceps. The IUD was removed in its entirety.Patient tolerated the procedure well.    Patient will use  COCPs for contraception/noplans for pregnancy soon and she was told to avoid teratogens, take PNV and folic acid.  Routine preventative health maintenance measures emphasized.    Assessment and Plan:  Alice Craig is a 34 y.o. female presenting  to the [facility] Department for a Women's Health problem visit  1. Encounter for contraceptive management, unspecified type COCPs rx - norgestimate-ethinyl estradiol (ORTHO-CYCLEN) 0.25-35 MG-MCG tablet; Take 1 tablet by mouth daily.  Dispense: 1 Package; Refill: 12     Return in about 1 year (around 02/26/2021) for annual well-woman exam.  No future appointments.  Lora Havens, PA-C

## 2020-07-27 ENCOUNTER — Ambulatory Visit (LOCAL_COMMUNITY_HEALTH_CENTER): Payer: Self-pay

## 2020-07-27 ENCOUNTER — Other Ambulatory Visit: Payer: Self-pay

## 2020-07-27 VITALS — BP 100/65 | Ht 61.0 in | Wt 152.0 lb

## 2020-07-27 DIAGNOSIS — Z3009 Encounter for other general counseling and advice on contraception: Secondary | ICD-10-CM

## 2020-07-27 DIAGNOSIS — Z3041 Encounter for surveillance of contraceptive pills: Secondary | ICD-10-CM

## 2020-07-27 MED ORDER — NORGESTIMATE-ETH ESTRADIOL 0.25-35 MG-MCG PO TABS: 1.0000 | ORAL_TABLET | Freq: Every day | ORAL | 0 refills | Status: AC

## 2020-07-27 NOTE — Progress Notes (Signed)
Here for ocp. Pt due for 5 packs to complete order for  Orthocyclen (Sprintec) written by A. Streilein, PA-C dated 02/27/2020. 8 packs Sprintec were given on 02/27/2020. Per pt, 2 of the Sprintec packs she has yet to open have expired. She does not have the packs with her. Pt counseled not to take expired pills.  Orthocyclen (Sprintec) 5 packs dispensed today (exp 01/25/2021)  to complete the order by A.Streilein as indicated above. Pt's questions answered and reports understanding. To call when starts last pack to schedule return appt.  M. Yemen as interpreter. Jerel Shepherd, RN

## 2021-02-24 ENCOUNTER — Telehealth: Payer: Self-pay | Admitting: Family Medicine

## 2021-02-24 NOTE — Telephone Encounter (Signed)
Pt called attempting to schedule appointment for IUD insertion. Would like to speak with nurse about alternative BC methods due to [facility] limited availability.

## 2021-02-24 NOTE — Telephone Encounter (Addendum)
Phone call to pt with interpreter Marlene Yemen. Pt states she has about 1 week of OCP left. Last menses was about 3 weeks ago and normal for her. Last sex 3 days ago. States she has not missed any pills or taken any late. Pt desires 10 year copper IUD; she has had it before, discovered the back pain she was having was due to kidney issues and not IUD. Last physical at [facility] was 02/27/20. Pt scheduled for 03/04/21 physical and IUD insertion. Pt counseled that if she runs out of OCP before appt, abstain from sex is best, but at least use back-up method like condoms with all sex. Pt denies allergies to Ibuprofen; counseled to take 800 mg before coming in for procedure (with food or milk).

## 2021-03-04 ENCOUNTER — Ambulatory Visit (LOCAL_COMMUNITY_HEALTH_CENTER): Payer: Self-pay | Admitting: Advanced Practice Midwife

## 2021-03-04 ENCOUNTER — Other Ambulatory Visit: Payer: Self-pay

## 2021-03-04 ENCOUNTER — Encounter: Payer: Self-pay | Admitting: Advanced Practice Midwife

## 2021-03-04 VITALS — BP 102/69 | Ht 61.0 in | Wt 149.0 lb

## 2021-03-04 DIAGNOSIS — Z30018 Encounter for initial prescription of other contraceptives: Secondary | ICD-10-CM

## 2021-03-04 DIAGNOSIS — Z3009 Encounter for other general counseling and advice on contraception: Secondary | ICD-10-CM

## 2021-03-04 DIAGNOSIS — Z30430 Encounter for insertion of intrauterine contraceptive device: Secondary | ICD-10-CM

## 2021-03-04 LAB — WET PREP FOR TRICH, YEAST, CLUE
Trichomonas Exam: NEGATIVE
Yeast Exam: NEGATIVE

## 2021-03-04 MED ORDER — PARAGARD INTRAUTERINE COPPER IU IUD: INTRAUTERINE_SYSTEM | Freq: Once | INTRAUTERINE | Status: AC

## 2021-03-04 NOTE — Progress Notes (Signed)
[facility] [address] Main Number: [phone]    Family Planning Visit- Initial Visit  Subjective:  Alice Craig is a 35 y.o. SHF [id number] (10,3) nonsmoker   being seen today for an initial annual visit and to discuss contraceptive options.  The patient is currently using Combination OCPs for pregnancy prevention. Patient reports she does not want a pregnancy in the next year.  Patient has the following medical conditions has Iron deficiency anemia; Preeclampsia; Preterm labor; and Overweight BMI=28.1 on their problem list.  Chief Complaint  Patient presents with  . Annual Exam  . Contraception    Paragard IUD    Patient reports wants physical and Paraguard today.  LMP today.  Last sex 02/27/21 without condom; with current partner x 11 years; 1 partner in last 3 mo. Paraguard inserted 12/25/17 and pt desired removal due to back pain on 02/27/20. Pt states she is still having back pain and her kidneys are being evaluated by a doctor.  Last ETOH 02/07/21 (2 beers) 1-2x/mo.  Not working.  Living with FOB and her 2 kids. Has been compliant with ocp's and not missed any.   Patient denies cigs, vaping, cigars, MJ  Body mass index is 28.16 kg/m. - Patient is eligible for diabetes screening based on BMI and age >31?  not applicable HA1C ordered? not applicable  Patient reports 1  partner/s in last year. Desires STI screening?  Yes  Has patient been screened once for HCV in the past?  No  No results found for: HCVAB  Does the patient have current drug use (including MJ), have a partner with drug use, and/or has been incarcerated since last result? No  If yes-- Screen for HCV through [facility]   Does the patient meet criteria for HBV testing? No  Criteria:  -Household, sexual or needle sharing contact with HBV -History of drug use -HIV positive -Those with known Hep C   Health Maintenance Due  Topic Date Due  .  Hepatitis C Screening  Never done  . TETANUS/TDAP  Never done  . PAP SMEAR-Modifier  01/29/2021    Review of Systems  All other systems reviewed and are negative.   The following portions of the patient's history were reviewed and updated as appropriate: allergies, current medications, past family history, past medical history, past social history, past surgical history and problem list. Problem list updated.   See flowsheet for other program required questions.  Objective:   Vitals:   03/04/21 1326  BP: 102/69  Weight: 149 lb 0.3 oz (67.6 kg)  Height: 5\' 1"  (1.549 m)    Physical Exam Constitutional:      Appearance: Normal appearance. She is normal weight.  HENT:     Head: Normocephalic and atraumatic.     Comments: Thyroid without masses or enlargement    Mouth/Throat:     Mouth: Mucous membranes are moist.     Comments: No visible caries seen Eyes:     Conjunctiva/sclera: Conjunctivae normal.  Cardiovascular:     Rate and Rhythm: Normal rate and regular rhythm.  Pulmonary:     Effort: Pulmonary effort is normal.     Breath sounds: Normal breath sounds.  Chest:  Breasts:     Right: Normal.     Left: Normal.    Abdominal:     Palpations: Abdomen is soft.     Comments: Soft without masses or tenderness, fair tone  Genitourinary:  General: Normal vulva.     Exam position: Lithotomy position.     Vagina: Normal. Bleeding: red menses blood.     Cervix: Normal.     Uterus: Normal.      Adnexa: Right adnexa normal and left adnexa normal.     Rectum: Normal.  Musculoskeletal:        General: Normal range of motion.     Cervical back: Normal range of motion and neck supple.  Skin:    General: Skin is warm and dry.  Neurological:     Mental Status: She is alert.  Psychiatric:        Mood and Affect: Mood normal.       Assessment and Plan:  Alice Craig is a 35 y.o. female presenting to the [facility] for an initial annual  wellness/contraceptive visit  Contraception counseling: Reviewed all forms of birth control options in the tiered based approach. available including abstinence; over the counter/barrier methods; hormonal contraceptive medication including pill, patch, ring, injection,contraceptive implant, ECP; hormonal and nonhormonal IUDs; permanent sterilization options including vasectomy and the various tubal sterilization modalities. Risks, benefits, and typical effectiveness rates were reviewed.  Questions were answered.  Written information was also given to the patient to review.  Patient desires Paraguard, this was prescribed for patient. She will follow up in  prn for surveillance.  She was told to call with any further questions, or with any concerns about this method of contraception.  Emphasized use of condoms 100% of the time for STI prevention.  Patient was offered ECP. ECP was not accepted by the patient. ECP counseling was not given - see RN documentation  1. Family planning Treat wet mount per standing orders Immunization nurse consult Please give dental list to pt - WET PREP FOR TRICH, YEAST, CLUE - Chlamydia/Gonorrhea [facility] - IGP, Aptima HPV  2. Encounter for initial prescription of other contraceptives   3. Encounter for IUD insertion    Patient presented to [facility] for IUD insertion. Her GC/CT screening was found to be up to date and using WHO criteria we can be reasonably certain she is not pregnant or a pregnancy test was obtained which was Urine pregnancy test  today was N/A.  See Flowsheet for IUD check list  IUD Insertion Procedure Note Patient identified, informed consent performed, consent signed.   Discussed risks of irregular bleeding, cramping, infection, malpositioning or misplacement of the IUD outside the uterus which may require further procedure such as laparoscopy. Time out was performed.    Speculum placed in the vagina.  Cervix visualized.  Cleaned with Betadine  x 2.  Cervix Grasped anteriorly with a single tooth tenaculum.  Uterus sounded to 8 cm.  IUD placed per manufacturer's recommendations.  Strings trimmed to 3 cm. Tenaculum was removed, good hemostasis noted.  Patient tolerated procedure well.   Patient was given post-procedure instructions- both agency handout and verbally by provider.  She was advised to have backup contraception for one week.  Patient was also asked to check IUD strings periodically or follow up in 4 weeks for IUD check.     Return if symptoms worsen or fail to improve.  No future appointments.  Alberteen Spindle, CNM

## 2021-03-04 NOTE — Progress Notes (Signed)
Pt to clinic for physical and IUD insertion, wants Paragard, Has been using OCP. Has not taken any late or missed any since last period, and is starting menses today.

## 2021-03-04 NOTE — Progress Notes (Signed)
Wet mount reviewed, no tx per standing order. Provider orders completed. 

## 2021-03-04 NOTE — Addendum Note (Signed)
Addended by: Tracey Harries on: 03/04/2021 03:40 PM   Modules accepted: Orders

## 2021-03-10 LAB — IGP, APTIMA HPV
HPV Aptima: NEGATIVE
PAP Smear Comment: 0
